# Patient Record
Sex: Male | Born: 1980 | Race: White | Hispanic: No | Marital: Married | Smoking: Current every day smoker
Health system: Southern US, Community
[De-identification: ages and names within clinical notes are randomized; demographics above are authoritative.]

## PROBLEM LIST (undated history)

## (undated) DIAGNOSIS — J9383 Other pneumothorax: Secondary | ICD-10-CM

## (undated) HISTORY — DX: Other pneumothorax: J93.83

---

## 2005-12-29 ENCOUNTER — Emergency Department: Payer: Self-pay | Admitting: Emergency Medicine

## 2008-09-09 ENCOUNTER — Emergency Department: Payer: Self-pay | Admitting: Emergency Medicine

## 2010-03-01 IMAGING — CT CT STONE STUDY
1 of 2 series · 15 of 32 positions shown, 19 images · non-contrast
Comparison: none

REASON FOR EXAM: L flank pain, eval for stone
COMMENTS:

PROCEDURE:     CT  - CT ABDOMEN /PELVIS WO (STONE)  - September 09, 2008  [DATE]
RESULT:
HISTORY: Flank pain.

[Series 2: stone · axial · 0.61mm/px · z∈[-480,-102]mm · 15 of 138 slices shown, 19 images]
[im 6/138  soft-tissue]
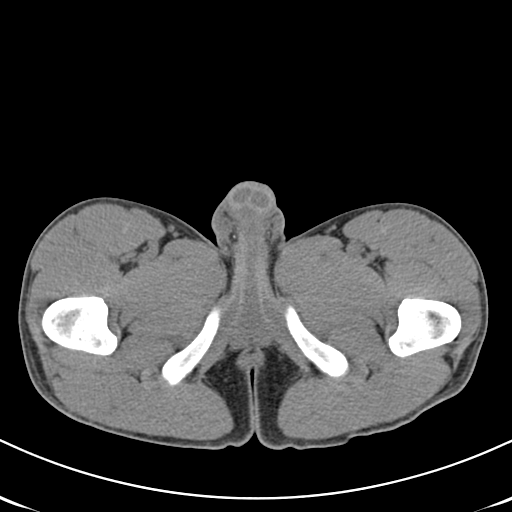
[im 6/138  bone]
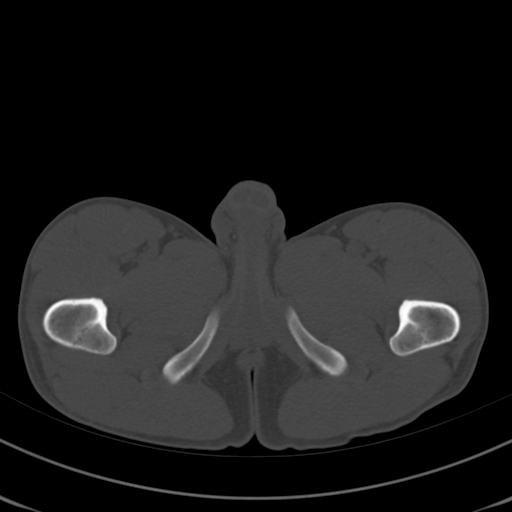
[im 17/138  soft-tissue]
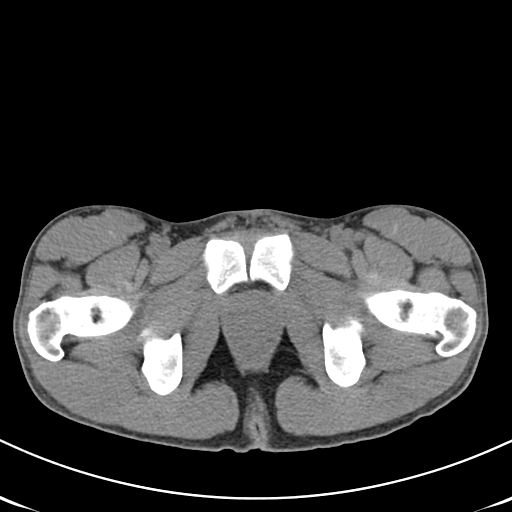
[im 28/138  soft-tissue]
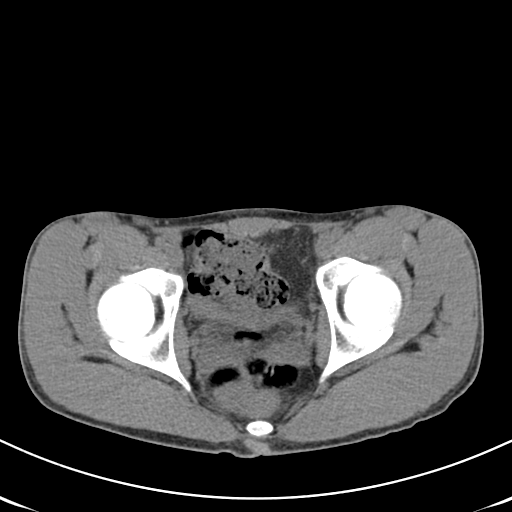
[im 39/138  soft-tissue]
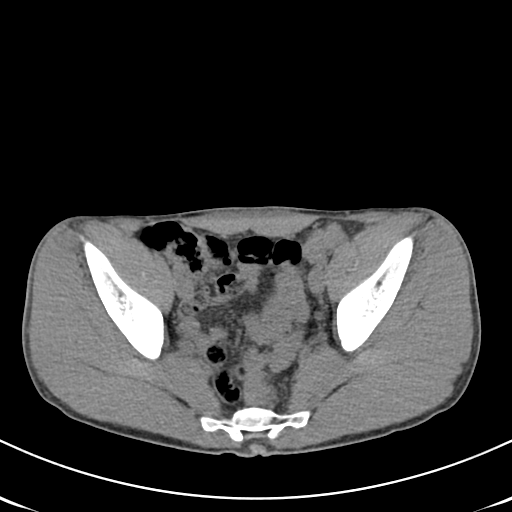
[im 50/138  soft-tissue]
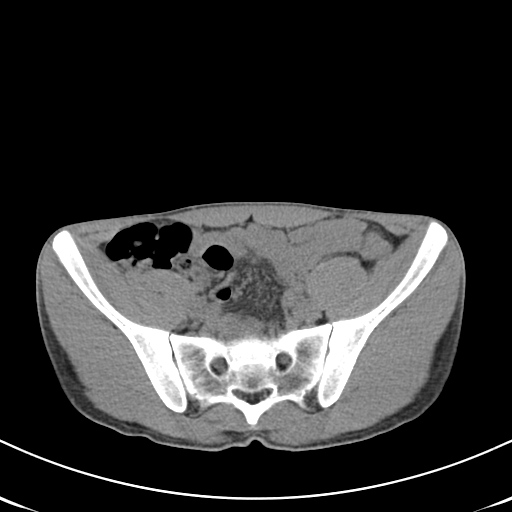
[im 61/138  soft-tissue]
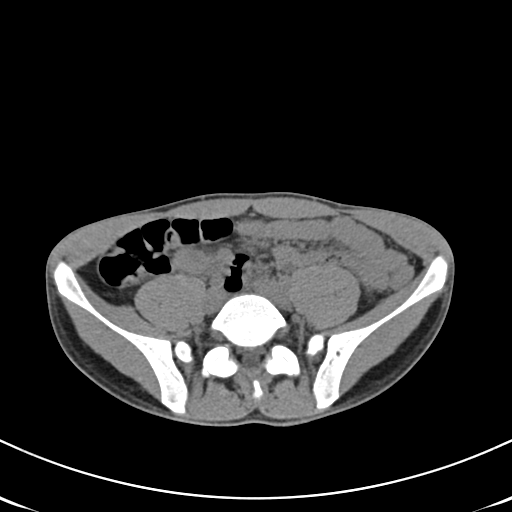
[im 72/138  soft-tissue]
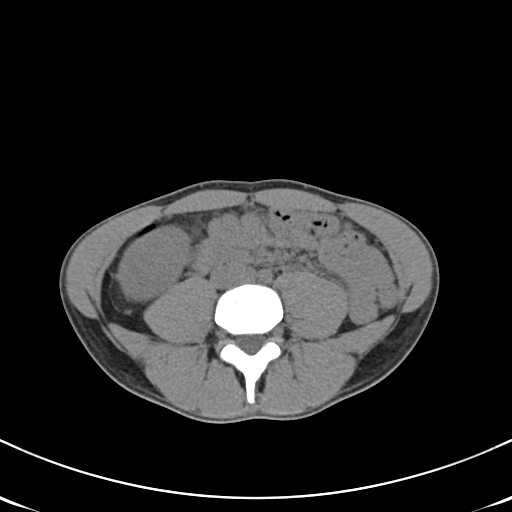
[im 77/138  soft-tissue]
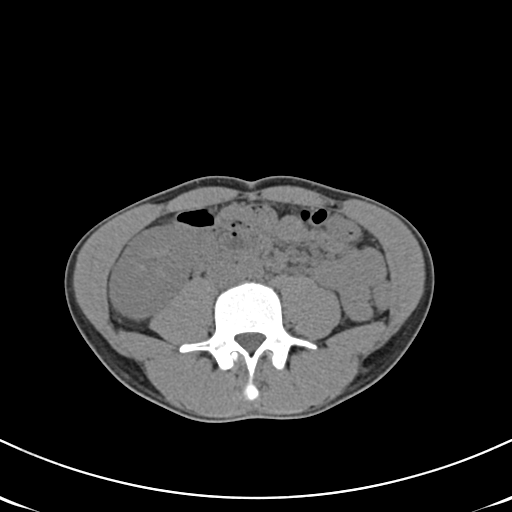
[im 88/138  soft-tissue]
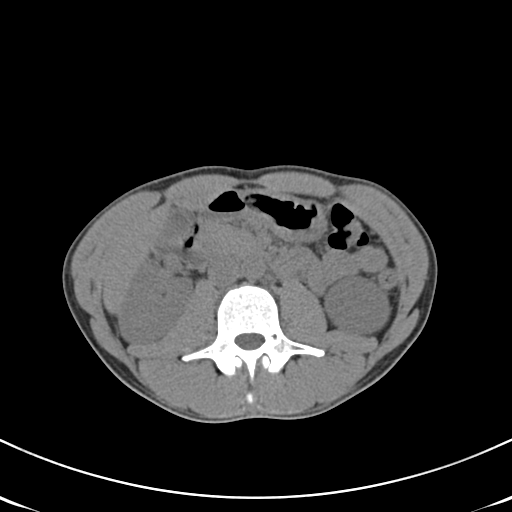
[im 88/138  bone]
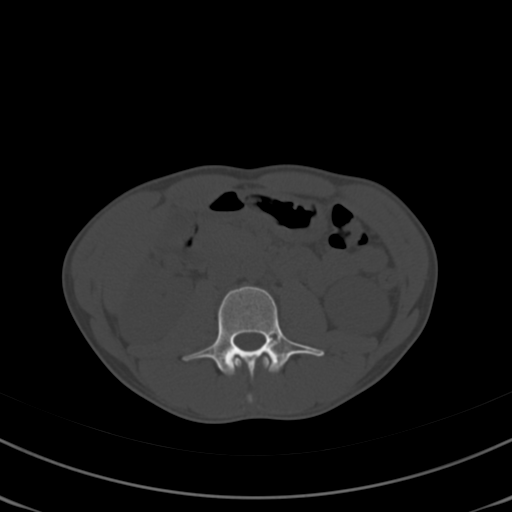
[im 99/138  soft-tissue]
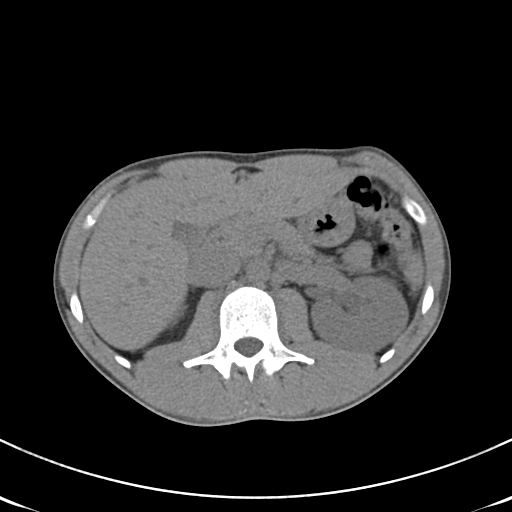
[im 110/138  soft-tissue]
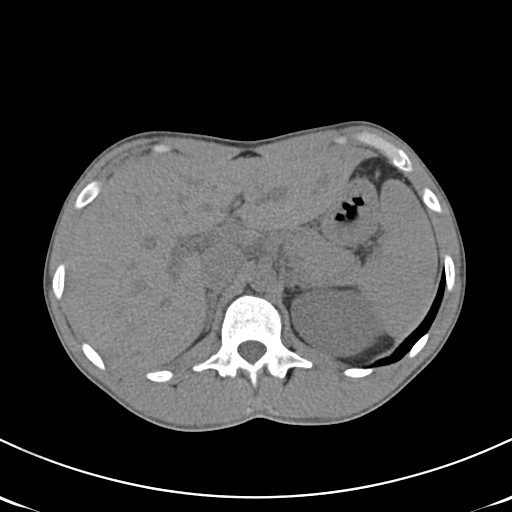
[im 116/138  lung]
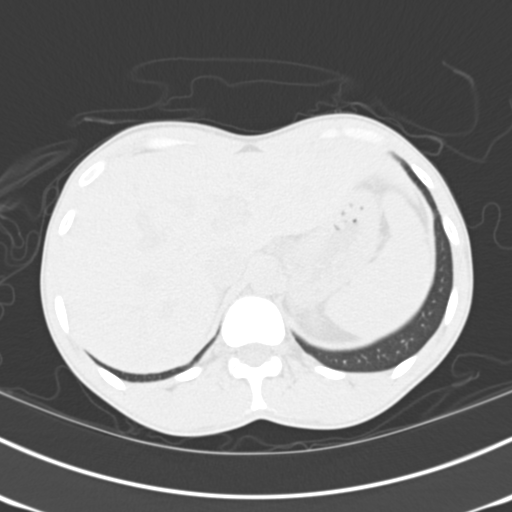
[im 121/138  soft-tissue]
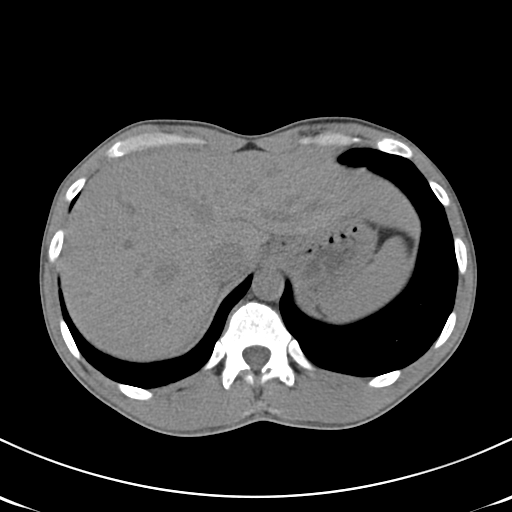
[im 121/138  lung]
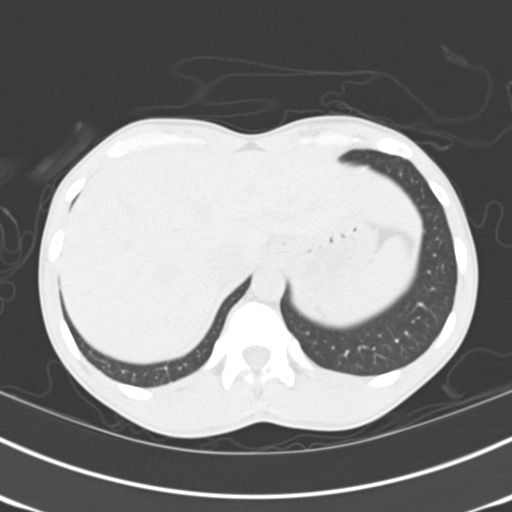
[im 127/138  lung]
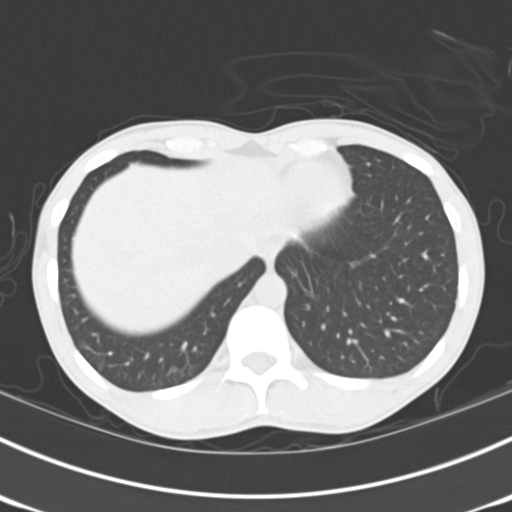
[im 132/138  soft-tissue]
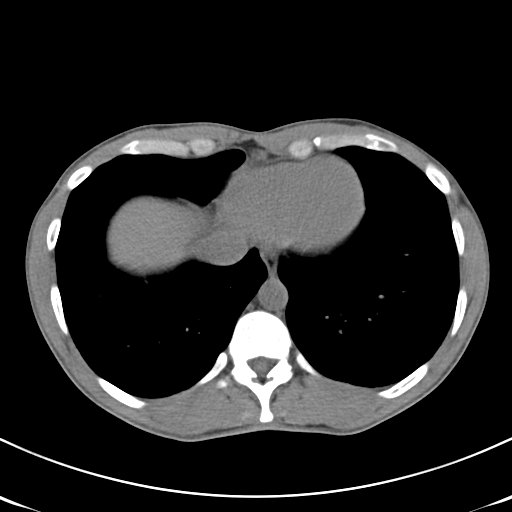
[im 132/138  lung]
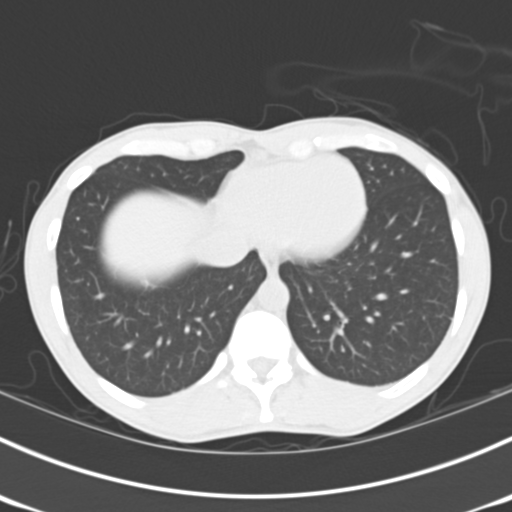

[15 of 32 positions shown; findings below may reference images not displayed]

COMPARISON STUDIES: No prior.

PROCEDURE AND FINDINGS: Standard non-enhanced CT of the abdomen and pelvis
is obtained. The liver and spleen are normal. The pancreas is normal. The
adrenals are normal. No focal renal abnormalities are identified.
Nephrolithiasis is noted.  The bladder is non-distended. Innumerable
calcified densities are noted in the pelvis consistent with phleboliths.
Non-obstructing ureteral stone cannot be excluded. No pathologic pelvic
fluid collections are identified. No acute bony abnormalities are
identified.  The lung bases are clear. No free air is noted.
IMPRESSION: 1. Nephrolithiasis. No obstructing ureteral stone or hydronephrosis is
noted. Multiple pelvic calcifications are noted.  These are most likely
phleboliths.  A distal ureteral stone cannot be completely excluded. Again,
no hydronephrosis is noted.  The appendix is not well visualized.

## 2017-08-21 ENCOUNTER — Encounter: Payer: Self-pay | Admitting: Emergency Medicine

## 2017-08-21 ENCOUNTER — Emergency Department
Admission: EM | Admit: 2017-08-21 | Discharge: 2017-08-21 | Disposition: A | Discharge status: Home / Self Care | Payer: Self-pay | Attending: Emergency Medicine | Admitting: Emergency Medicine

## 2017-08-21 ENCOUNTER — Other Ambulatory Visit: Payer: Self-pay

## 2017-08-21 DIAGNOSIS — K047 Periapical abscess without sinus: Secondary | ICD-10-CM | POA: Insufficient documentation

## 2017-08-21 DIAGNOSIS — F17210 Nicotine dependence, cigarettes, uncomplicated: Secondary | ICD-10-CM | POA: Insufficient documentation

## 2017-08-21 MED ORDER — AMOXICILLIN 500 MG PO CAPS: 500.0000 mg | ORAL_CAPSULE | Freq: Three times a day (TID) | ORAL | 0 refills | Status: DC

## 2017-08-21 MED ORDER — TRAMADOL HCL 50 MG PO TABS: 50.0000 mg | ORAL_TABLET | Freq: Four times a day (QID) | ORAL | 0 refills | Status: DC | PRN

## 2017-08-21 NOTE — Discharge Instructions (Addendum)
OPTIONS FOR DENTAL FOLLOW UP CARE ° °[place] [facility] - Local Safety Net Dental Clinics °http://www.ncdhhs.gov/dph/oralhealth/services/safetynetclinics.htm °  °[facility] (336-562-3123) ° °[facility] [place] (919-933-9087) ° °[facility] ([phone] ext [phone]) ° °[facility] ([phone]) ° °[facility] ([phone]) °This clinic caters to the indigent population and is on a lottery system. °Location: °[facility], [address], [place] °Clinic Hours: °Wednesdays from 6pm - 9pm, patients seen by a lottery system. °For dates, call or go to www.med.[facility].edu/[facility]/patients/Dental-[facility] °Services: °Cleanings, fillings and simple extractions. °Payment Options: °DENTAL WORK IS FREE OF CHARGE. Bring proof of income or support. °Best way to get seen: °Arrive at 5:15 pm - this is a lottery, NOT first come/first serve, so arriving earlier will not increase your chances of being seen. °  °  °[facility] °919-537-3737 °Select option 1 for emergencies °  °Location: °[facility], [address], [place] °Clinic Hours: °No walk-ins accepted - call the day before to schedule an appointment. °Check in times are 9:30 am and 1:30 pm. °Services: °Simple extractions, temporary fillings, pulpectomy/pulp debridement, uncomplicated abscess drainage. °Payment Options: °PAYMENT IS DUE AT THE TIME OF SERVICE.  Fee is usually $100-200, additional surgical procedures (e.g. abscess drainage) may be extra. °Cash, checks, Visa/MasterCard accepted.  Can file Medicaid if patient is covered for dental - patient should call case worker to check. °No discount for [facility] patients. °Best way to get seen: °MUST call the day before and get onto the schedule. Can usually be seen the next 1-2 days. No walk-ins accepted. °  °  °[facility] °[phone] °   °Location: °[facility], [address], [place] °Clinic Hours: °M, W, Th, F 8am or 1:30pm, Tues 9a or 1:30 - first come/first served. °Services: °Simple extractions, temporary fillings, uncomplicated abscess drainage.  You do not need to be an [place] resident. °Payment Options: °PAYMENT IS DUE AT THE TIME OF SERVICE. °Dental insurance, otherwise sliding scale - bring proof of income or support. °Depending on income and treatment needed, cost is usually $50-200. °Best way to get seen: °Arrive early as it is first come/first served. °  °  °[facility] °[phone] °  °Location: °[address] °Clinic Hours: °Mon-Thu 8a-5p °Services: °Most basic dental services including extractions and fillings. °Payment Options: °PAYMENT IS DUE AT THE TIME OF SERVICE. °Sliding scale, up to 50% off - bring proof if income or support. °Medicaid with dental option accepted. °Best way to get seen: °Call to schedule an appointment, can usually be seen within 2 weeks OR they will try to see walk-ins - show up at 8a or 2p (you may have to wait). °  °  °[facility] °919-245-2435 °[place] RESIDENTS ONLY °  °Location: °[facility], [address], [place], [place] [address] °Clinic Hours: By appointment only. °Monday - Thursday 8am-5pm, Friday 8am-12pm °Services: Cleanings, fillings, extractions. °Payment Options: °PAYMENT IS DUE AT THE TIME OF SERVICE. °Cash, Visa or MasterCard. Sliding scale - $30 minimum per service. °Best way to get seen: °Come in to office, complete packet and make an appointment - need proof of income °or support monies for each household member and proof of [place] residence. °Usually takes about a month to get in. °  °  °[facility] °[phone] °  °Location: °[address]   [place] °Clinic Hours: Walk-in Urgent Care Dental Services are offered Monday-Friday  mornings only. °The numbers of emergencies accepted daily is limited to the number of °providers available. °Maximum 15 - Mondays, Wednesdays & Thursdays °Maximum 10 - Tuesdays & Fridays °Services: °You do not need to be a [place] resident to be seen for a dental emergency. °Emergencies are defined as pain, swelling, abnormal bleeding, or dental trauma. Walkins will receive x-rays if needed. °NOTE: Dental cleaning is not an emergency. °Payment Options: °PAYMENT IS DUE AT THE TIME OF SERVICE. °Minimum co-pay is $40.00 for uninsured patients. °Minimum co-pay is $3.00 for Medicaid with dental coverage. °Dental Insurance is accepted and must be presented at time of visit. °Medicare does not cover dental. °Forms of payment: Cash, credit card, checks. °Best way to get seen: °If not previously registered with the clinic, walk-in dental registration begins at 7:15 am and is on a first come/first serve basis. °If previously registered with the clinic, call to make an appointment. °  °  °[facility] °[phone] °[place] RESIDENTS ONLY °  °Location: °[address], [place], [place] °Clinic Hours: °Mon-Thu 10a-2p °Services: Extractions only! °Payment Options: °FREE (donations accepted) - bring proof of income or support °Best way to get seen: °Call and schedule an appointment OR come at 8am on the 1st Monday of every month (except for holidays) when it is first come/first served. °  °  °[facility] °[phone] °  °Location: °[address], [place] °Clinic Hours: °Friday mornings °Services, Payment Options, Best way to get seen: °Call for info °

## 2017-08-21 NOTE — ED Provider Notes (Signed)
[facility] Emergency Department Provider Note  ____________________________________________   None    (approximate)  I have reviewed the triage vital signs and the nursing notes.   HISTORY  Chief Complaint Dental Pain    HPI Brian Brennan is a 37 y.o. male reports emergency department complaining of left lower jaw swelling and tooth pain for 2 days.  He states he does have bad teeth.  He did not know if this is from his sinuses.  He denies any fever or chills at this time.  He denies chest pain or shortness of breath   History reviewed. No pertinent past medical history.  There are no active problems to display for this patient.   History reviewed. No pertinent surgical history.  Prior to Admission medications   Medication Sig Start Date End Date Taking? Authorizing Provider  amoxicillin (AMOXIL) 500 MG capsule Take 1 capsule (500 mg total) by mouth 3 (three) times daily. 08/21/17   Robinn Overholt, Roselyn Bering, PA-C  traMADol (ULTRAM) 50 MG tablet Take 1 tablet (50 mg total) by mouth every 6 (six) hours as needed. 08/21/17   Faythe Ghee, PA-C    Allergies Patient has no known allergies.  No family history on file.  Social History Social History   Tobacco Use  . Smoking status: Current Every Day Smoker    Types: Cigarettes  . Smokeless tobacco: Never Used  Substance Use Topics  . Alcohol use: Not on file  . Drug use: Not on file    Review of Systems  Constitutional: No fever/chills Eyes: No visual changes. ENT: No sore throat.  Positive for dental pain and left-sided jaw swelling Respiratory: Denies cough Genitourinary: Negative for dysuria. Musculoskeletal: Negative for back pain. Skin: Negative for rash.    ____________________________________________   PHYSICAL EXAM:  VITAL SIGNS: ED Triage Vitals  Enc Vitals Group     BP 08/21/17 1223 122/65     Pulse Rate 08/21/17 1223 85     Resp 08/21/17 1223 14     Temp 08/21/17  1223 98.3 F (36.8 C)     Temp Source 08/21/17 1223 Oral     SpO2 08/21/17 1223 97 %     Weight 08/21/17 1211 125 lb (56.7 kg)     Height 08/21/17 1211  (1.651 m)     Head Circumference --      Peak Flow --      Pain Score 08/21/17 1211 7     Pain Loc --      Pain Edu? --      Excl. in GC? --     Constitutional: Alert and oriented. Well appearing and in no acute distress. Eyes: Conjunctivae are normal.  Head: Atraumatic.  Positive for swelling of the left jaw about the size of a ping-pong ball Nose: No congestion/rhinnorhea. Mouth/Throat: Mucous membranes are moist.  The patient has widespread poor dentition.  The teeth are broken at the gumline.  There is swelling along the left lower gumline.  No fluctuant abscesses noted at this time. Neck: Is supple, no lymphadenopathy is noted Cardiovascular: Normal rate, regular rhythm. Respiratory: Normal respiratory effort.  No retractions GU: deferred Musculoskeletal: FROM all extremities, warm and well perfused Neurologic:  Normal speech and language.  Skin:  Skin is warm, dry and intact. No rash noted. Psychiatric: Mood and affect are normal. Speech and behavior are normal.  ____________________________________________   LABS (all labs ordered are listed, but only abnormal results are displayed)  Labs  Reviewed - No data to display ____________________________________________   ____________________________________________  RADIOLOGY    ____________________________________________   PROCEDURES  Procedure(s) performed: No  Procedures    ____________________________________________   INITIAL IMPRESSION / ASSESSMENT AND PLAN / ED COURSE  Pertinent labs & imaging results that were available during my care of the patient were reviewed by me and considered in my medical decision making (see chart for details).  Patient is a 37 year old male presents emergency department complaining of left lower jaw pain and  swelling.  Symptoms for 2 days.  Physical exam left jaw is swollen and there is poor dentition.  Diagnosis is acute dental abscess.  Patient was given a prescription for amoxicillin and tramadol.  He is to continue taking ibuprofen.  He was given a list of dental clinics for follow-up.  He states he understands will comply with instructions.  Was discharged in stable condition     As part of my medical decision making, I reviewed the following data within the electronic MEDICAL RECORD NUMBER Nursing notes reviewed and incorporated, Notes from prior ED visits and [place] Controlled Substance Database  ____________________________________________   FINAL CLINICAL IMPRESSION(S) / ED DIAGNOSES  Final diagnoses:  Dental abscess      NEW MEDICATIONS STARTED DURING THIS VISIT:  New Prescriptions   AMOXICILLIN (AMOXIL) 500 MG CAPSULE    Take 1 capsule (500 mg total) by mouth 3 (three) times daily.   TRAMADOL (ULTRAM) 50 MG TABLET    Take 1 tablet (50 mg total) by mouth every 6 (six) hours as needed.     Note:  This document was prepared using Dragon voice recognition software and may include unintentional dictation errors.    Faythe Ghee, PA-C 08/21/17 1250    Emily Filbert, MD 08/21/17 [phone]

## 2017-08-21 NOTE — ED Triage Notes (Signed)
Left lower jaw tooth pain and swelling x 2 days.

## 2019-03-15 ENCOUNTER — Other Ambulatory Visit: Payer: Self-pay

## 2019-03-15 DIAGNOSIS — Z20822 Contact with and (suspected) exposure to covid-19: Secondary | ICD-10-CM

## 2019-03-16 LAB — NOVEL CORONAVIRUS, NAA: SARS-CoV-2, NAA: NOT DETECTED

## 2022-04-22 DIAGNOSIS — Z202 Contact with and (suspected) exposure to infections with a predominantly sexual mode of transmission: Secondary | ICD-10-CM | POA: Diagnosis not present

## 2023-08-19 ENCOUNTER — Ambulatory Visit: Payer: Self-pay | Admitting: Family Medicine

## 2023-08-19 ENCOUNTER — Encounter: Payer: Self-pay | Admitting: Family Medicine

## 2023-08-19 VITALS — BP 100/65 | HR 72 | Temp 98.0°F | Resp 18 | Ht 66.0 in | Wt 98.5 lb

## 2023-08-19 DIAGNOSIS — Z7689 Persons encountering health services in other specified circumstances: Secondary | ICD-10-CM

## 2023-08-19 DIAGNOSIS — J301 Allergic rhinitis due to pollen: Secondary | ICD-10-CM

## 2023-08-19 MED ORDER — MONTELUKAST SODIUM 10 MG PO TABS: 10.0000 mg | ORAL_TABLET | Freq: Every day | ORAL | 1 refills | Status: DC
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
## 2023-09-22 ENCOUNTER — Encounter: Payer: Self-pay | Admitting: Family Medicine

## 2023-09-22 ENCOUNTER — Ambulatory Visit (INDEPENDENT_AMBULATORY_CARE_PROVIDER_SITE_OTHER): Admitting: Family Medicine

## 2023-09-22 VITALS — BP 96/63 | HR 66 | Temp 97.6°F | Resp 18 | Ht 66.0 in | Wt 102.4 lb

## 2023-09-22 DIAGNOSIS — R7302 Impaired glucose tolerance (oral): Secondary | ICD-10-CM | POA: Diagnosis not present

## 2023-09-22 DIAGNOSIS — Z136 Encounter for screening for cardiovascular disorders: Secondary | ICD-10-CM | POA: Diagnosis not present

## 2023-09-22 DIAGNOSIS — Z125 Encounter for screening for malignant neoplasm of prostate: Secondary | ICD-10-CM | POA: Diagnosis not present

## 2023-09-22 DIAGNOSIS — Z13220 Encounter for screening for lipoid disorders: Secondary | ICD-10-CM

## 2023-09-22 DIAGNOSIS — Z1211 Encounter for screening for malignant neoplasm of colon: Secondary | ICD-10-CM

## 2023-09-22 DIAGNOSIS — Z0000 Encounter for general adult medical examination without abnormal findings: Secondary | ICD-10-CM | POA: Diagnosis not present

## 2023-09-22 NOTE — Progress Notes (Signed)
 Complete physical exam  Patient: Brian Brennan   DOB: 06/25/1980   43 y.o. Male  MRN: [id number]  Subjective:    Chief Complaint  Patient presents with   Annual Exam    Patient is here for annual Physical    Brian Brennan is a 43 y.o. male who presents today for a complete physical exam. He reports consuming a general diet. The patient does not participate in regular exercise at present. He generally feels well. He reports sleeping fairly well. He does not have additional problems to discuss today.    Most recent fall risk assessment:    08/19/2023    2:55 PM  Fall Risk   Falls in the past year? 0  Number falls in past yr: 0  Injury with Fall? 0  Risk for fall due to : No Fall Risks  Follow up Falls evaluation completed     Most recent depression screenings:    08/19/2023    2:57 PM  PHQ 2/9 Scores  PHQ - 2 Score 0  PHQ- 9 Score 1    Vision:Not within last year   There are no active problems to display for this patient.  Past Medical History:  Diagnosis Date   Spontaneous pneumothorax    Social History   Tobacco Use   Smoking status: Every Day    Types: Cigarettes    Passive exposure: Current   Smokeless tobacco: Never  Vaping Use   Vaping status: Never Used  Substance Use Topics   Alcohol use: Yes    Comment: socially   Drug use: Never   Family Status  Relation Name Status   Mother  Alive   Father  Deceased  No partnership data on file   Allergies  Allergen Reactions   Codeine Nausea And Vomiting and Nausea Only      Patient Care Team: Manette Section, MD as PCP - General (Family Medicine)   Outpatient Medications Prior to Visit  Medication Sig   montelukast  (SINGULAIR ) 10 MG tablet Take 1 tablet (10 mg total) by mouth at bedtime.   No facility-administered medications prior to visit.    Review of Systems  All other systems reviewed and are negative.         Objective:     BP 96/63   Pulse 66   Temp 97.6 F (36.4  C) (Oral)   Resp 18   Ht 5' 6 (1.676 m)   Wt 102 lb 5.8 oz (46.4 kg)   SpO2 99%   BMI 16.52 kg/m  BP Readings from Last 3 Encounters:  09/22/23 96/63  08/19/23 100/65  08/21/17 122/65      Physical Exam Vitals and nursing note reviewed.  Constitutional:      Appearance: Normal appearance. He is normal weight.  HENT:     Head: Normocephalic and atraumatic.     Right Ear: External ear normal.     Left Ear: External ear normal.     Nose: Nose normal.     Mouth/Throat:     Mouth: Mucous membranes are moist.     Pharynx: Oropharynx is clear.   Eyes:     Conjunctiva/sclera: Conjunctivae normal.     Pupils: Pupils are equal, round, and reactive to light.    Cardiovascular:     Rate and Rhythm: Normal rate and regular rhythm.     Pulses: Normal pulses.     Heart sounds: Normal heart sounds.  Pulmonary:     Effort: Pulmonary  effort is normal.     Breath sounds: Normal breath sounds.  Abdominal:     General: Abdomen is flat. Bowel sounds are normal.   Skin:    General: Skin is warm.     Capillary Refill: Capillary refill takes less than 2 seconds.   Neurological:     General: No focal deficit present.     Mental Status: He is alert and oriented to person, place, and time. Mental status is at baseline.   Psychiatric:        Mood and Affect: Mood normal.        Behavior: Behavior normal.        Thought Content: Thought content normal.        Judgment: Judgment normal.      No results found for any visits on 09/22/23. Last CBC No results found for: WBC, HGB, HCT, MCV, MCH, RDW, PLT Last metabolic panel No results found for: GLUCOSE, NA, K, CL, CO2, BUN, CREATININE, EGFR, CALCIUM, PHOS, PROT, ALBUMIN, LABGLOB, AGRATIO, BILITOT, ALKPHOS, AST, ALT, ANIONGAP Last lipids No results found for: CHOL, HDL, LDLCALC, LDLDIRECT, TRIG, CHOLHDL Last hemoglobin A1c No results found for: HGBA1C       Assessment & Plan:    Routine Health Maintenance and Physical Exam   There is no immunization history on file for this patient.  Health Maintenance  Topic Date Due   HPV VACCINES (1 - Male 3-dose series) Never done   HIV Screening  Never done   Hepatitis C Screening  Never done   Pneumococcal Vaccine 32-2 Years old (1 of 2 - PCV) Never done   INFLUENZA VACCINE  11/07/2023   Meningococcal B Vaccine  Aged Out   DTaP/Tdap/Td  Discontinued   COVID-19 Vaccine  Discontinued    Discussed health benefits of physical activity, and encouraged him to engage in regular exercise appropriate for his age and condition.  Problem List Items Addressed This Visit   None Visit Diagnoses       Annual physical exam    -  Primary     Encounter for lipid screening for cardiovascular disease       Relevant Orders   Lipid panel     Impaired glucose tolerance       Relevant Orders   CBC with Differential/Platelet   Comprehensive metabolic panel with GFR   Hemoglobin A1c     Screening PSA (prostate specific antigen)       Relevant Orders   PSA     Screening for colon cancer       Relevant Orders   Ambulatory referral to Gastroenterology      Return in about 1 year (around 09/21/2024) for Annual Physical. Annual physical exam  Encounter for lipid screening for cardiovascular disease -     Lipid panel  Impaired glucose tolerance -     CBC with Differential/Platelet -     Comprehensive metabolic panel with GFR -     Hemoglobin A1c  Screening PSA (prostate specific antigen) -     PSA  Screening for colon cancer -     Ambulatory referral to Gastroenterology   Screening labs and refer to GI for consult for colonoscopy.    Manette Section, MD

## 2023-09-23 LAB — CBC WITH DIFFERENTIAL/PLATELET
Basophils Absolute: 0.1 10*3/uL (ref 0.0–0.2)
Basos: 1 %
EOS (ABSOLUTE): 0.5 10*3/uL — ABNORMAL HIGH (ref 0.0–0.4)
Eos: 5 %
Hematocrit: 43.7 % (ref 37.5–51.0)
Hemoglobin: 14.5 g/dL (ref 13.0–17.7)
Immature Grans (Abs): 0 10*3/uL (ref 0.0–0.1)
Immature Granulocytes: 0 %
Lymphocytes Absolute: 3.9 10*3/uL — ABNORMAL HIGH (ref 0.7–3.1)
Lymphs: 39 %
MCH: 30.7 pg (ref 26.6–33.0)
MCHC: 33.2 g/dL (ref 31.5–35.7)
MCV: 93 fL (ref 79–97)
Monocytes Absolute: 0.6 10*3/uL (ref 0.1–0.9)
Monocytes: 6 %
Neutrophils Absolute: 4.8 10*3/uL (ref 1.4–7.0)
Neutrophils: 49 %
Platelets: 261 10*3/uL (ref 150–450)
RBC: 4.72 x10E6/uL (ref 4.14–5.80)
RDW: 11.7 % (ref 11.6–15.4)
WBC: 9.9 10*3/uL (ref 3.4–10.8)

## 2023-09-23 LAB — COMPREHENSIVE METABOLIC PANEL WITH GFR
ALT: 16 IU/L (ref 0–44)
AST: 21 IU/L (ref 0–40)
Albumin: 4.2 g/dL (ref 4.1–5.1)
Alkaline Phosphatase: 70 IU/L (ref 44–121)
BUN/Creatinine Ratio: 18 (ref 9–20)
BUN: 17 mg/dL (ref 6–24)
Bilirubin Total: 0.3 mg/dL (ref 0.0–1.2)
CO2: 20 mmol/L (ref 20–29)
Calcium: 9.1 mg/dL (ref 8.7–10.2)
Chloride: 106 mmol/L (ref 96–106)
Creatinine, Ser: 0.95 mg/dL (ref 0.76–1.27)
Globulin, Total: 2.6 g/dL (ref 1.5–4.5)
Glucose: 90 mg/dL (ref 70–99)
Potassium: 5 mmol/L (ref 3.5–5.2)
Sodium: 139 mmol/L (ref 134–144)
Total Protein: 6.8 g/dL (ref 6.0–8.5)
eGFR: 101 mL/min/{1.73_m2} (ref >59)

## 2023-09-23 LAB — LIPID PANEL
Chol/HDL Ratio: 3.8 ratio (ref 0.0–5.0)
Cholesterol, Total: 218 mg/dL — ABNORMAL HIGH (ref 100–199)
HDL: 57 mg/dL (ref >39)
LDL Chol Calc (NIH): 144 mg/dL — ABNORMAL HIGH (ref 0–99)
Triglycerides: 98 mg/dL (ref 0–149)
VLDL Cholesterol Cal: 17 mg/dL (ref 5–40)

## 2023-09-23 LAB — HEMOGLOBIN A1C
Est. average glucose Bld gHb Est-mCnc: 105 mg/dL
Hgb A1c MFr Bld: 5.3 % (ref 4.8–5.6)

## 2023-09-23 LAB — PSA: Prostate Specific Ag, Serum: 2.3 ng/mL (ref 0.0–4.0)

## 2023-09-24 ENCOUNTER — Ambulatory Visit: Payer: Self-pay | Admitting: Family Medicine

## 2023-09-24 ENCOUNTER — Other Ambulatory Visit: Payer: Self-pay | Admitting: Family Medicine

## 2023-09-24 DIAGNOSIS — J301 Allergic rhinitis due to pollen: Secondary | ICD-10-CM

## 2023-09-24 MED ORDER — FLUTICASONE PROPIONATE 50 MCG/ACT NA SUSP: 1.0000 | Freq: Every day | NASAL | 6 refills | Status: AC

## 2023-10-14 ENCOUNTER — Telehealth: Payer: Self-pay

## 2023-10-14 NOTE — Telephone Encounter (Signed)
 Called patient to let him know that paperwork was completed and ready for pickup.

## 2023-10-14 NOTE — Telephone Encounter (Signed)
 Patient's wife dropped off paperwork for you to fill out for a medical evaluation from [place]  [facility]. I've placed paperwork in the work box.

## 2023-11-03 ENCOUNTER — Other Ambulatory Visit: Payer: Self-pay | Admitting: Family Medicine

## 2023-11-03 ENCOUNTER — Encounter: Payer: Self-pay | Admitting: Family Medicine

## 2023-11-03 DIAGNOSIS — J301 Allergic rhinitis due to pollen: Secondary | ICD-10-CM

## 2023-12-31 ENCOUNTER — Other Ambulatory Visit: Payer: Self-pay | Admitting: Family Medicine

## 2023-12-31 DIAGNOSIS — J301 Allergic rhinitis due to pollen: Secondary | ICD-10-CM

## 2024-01-20 DIAGNOSIS — R051 Acute cough: Secondary | ICD-10-CM | POA: Diagnosis not present

## 2024-02-25 ENCOUNTER — Other Ambulatory Visit: Payer: Self-pay | Admitting: Family Medicine

## 2024-02-25 DIAGNOSIS — J301 Allergic rhinitis due to pollen: Secondary | ICD-10-CM

## 2024-05-01 ENCOUNTER — Other Ambulatory Visit: Payer: Self-pay | Admitting: Family Medicine

## 2024-05-01 DIAGNOSIS — J301 Allergic rhinitis due to pollen: Secondary | ICD-10-CM

## 2024-09-22 ENCOUNTER — Encounter: Admitting: Family Medicine
